# Patient Record
Sex: Male | Born: 1985 | Race: White | Hispanic: No | Marital: Married | Smoking: Never smoker
Health system: Southern US, Community
[De-identification: ages and names within clinical notes are randomized; demographics above are authoritative.]

## PROBLEM LIST (undated history)

## (undated) DIAGNOSIS — N289 Disorder of kidney and ureter, unspecified: Secondary | ICD-10-CM

## (undated) DIAGNOSIS — B2790 Infectious mononucleosis, unspecified without complication: Secondary | ICD-10-CM

## (undated) DIAGNOSIS — G43909 Migraine, unspecified, not intractable, without status migrainosus: Secondary | ICD-10-CM

## (undated) HISTORY — PX: HEMORRHOID SURGERY: SHX153

## (undated) HISTORY — PX: WISDOM TOOTH EXTRACTION: SHX21

---

## 2014-03-25 ENCOUNTER — Emergency Department (HOSPITAL_COMMUNITY): Payer: BLUE CROSS/BLUE SHIELD

## 2014-03-25 ENCOUNTER — Encounter (HOSPITAL_COMMUNITY): Payer: Self-pay | Admitting: *Deleted

## 2014-03-25 ENCOUNTER — Emergency Department (HOSPITAL_COMMUNITY)
Admission: EM | Admit: 2014-03-25 | Discharge: 2014-03-26 | Disposition: A | Discharge status: Home / Self Care | Payer: BLUE CROSS/BLUE SHIELD | Attending: Emergency Medicine | Admitting: Emergency Medicine

## 2014-03-25 DIAGNOSIS — R5383 Other fatigue: Secondary | ICD-10-CM | POA: Diagnosis not present

## 2014-03-25 DIAGNOSIS — G8929 Other chronic pain: Secondary | ICD-10-CM | POA: Diagnosis not present

## 2014-03-25 DIAGNOSIS — R05 Cough: Secondary | ICD-10-CM | POA: Insufficient documentation

## 2014-03-25 DIAGNOSIS — R1110 Vomiting, unspecified: Secondary | ICD-10-CM | POA: Diagnosis not present

## 2014-03-25 DIAGNOSIS — R42 Dizziness and giddiness: Secondary | ICD-10-CM | POA: Diagnosis present

## 2014-03-25 DIAGNOSIS — J029 Acute pharyngitis, unspecified: Secondary | ICD-10-CM | POA: Insufficient documentation

## 2014-03-25 DIAGNOSIS — M545 Low back pain: Secondary | ICD-10-CM | POA: Diagnosis not present

## 2014-03-25 DIAGNOSIS — R6889 Other general symptoms and signs: Secondary | ICD-10-CM

## 2014-03-25 LAB — CBC WITH DIFFERENTIAL/PLATELET
BASOS ABS: 0 10*3/uL (ref 0.0–0.1)
BASOS PCT: 0 % (ref 0–1)
EOS PCT: 5 % (ref 0–5)
Eosinophils Absolute: 0.4 10*3/uL (ref 0.0–0.7)
HCT: 43.7 % (ref 39.0–52.0)
HEMOGLOBIN: 15 g/dL (ref 13.0–17.0)
Lymphocytes Relative: 43 % (ref 12–46)
Lymphs Abs: 4.2 10*3/uL — ABNORMAL HIGH (ref 0.7–4.0)
MCH: 30 pg (ref 26.0–34.0)
MCHC: 34.3 g/dL (ref 30.0–36.0)
MCV: 87.4 fL (ref 78.0–100.0)
Monocytes Absolute: 0.7 10*3/uL (ref 0.1–1.0)
Monocytes Relative: 8 % (ref 3–12)
Neutro Abs: 4.3 10*3/uL (ref 1.7–7.7)
Neutrophils Relative %: 44 % (ref 43–77)
PLATELETS: 238 10*3/uL (ref 150–400)
RBC: 5 MIL/uL (ref 4.22–5.81)
RDW: 13.5 % (ref 11.5–15.5)
WBC: 9.6 10*3/uL (ref 4.0–10.5)

## 2014-03-25 LAB — URINALYSIS, ROUTINE W REFLEX MICROSCOPIC
Bilirubin Urine: NEGATIVE
Glucose, UA: NEGATIVE mg/dL
HGB URINE DIPSTICK: NEGATIVE
KETONES UR: NEGATIVE mg/dL
LEUKOCYTES UA: NEGATIVE
Nitrite: NEGATIVE
PH: 6 (ref 5.0–8.0)
Protein, ur: NEGATIVE mg/dL
Specific Gravity, Urine: 1.025 (ref 1.005–1.030)
UROBILINOGEN UA: 0.2 mg/dL (ref 0.0–1.0)

## 2014-03-25 LAB — BASIC METABOLIC PANEL
Anion gap: 5 (ref 5–15)
BUN: 15 mg/dL (ref 6–23)
CALCIUM: 9.2 mg/dL (ref 8.4–10.5)
CO2: 29 mmol/L (ref 19–32)
Chloride: 104 mmol/L (ref 96–112)
Creatinine, Ser: 0.72 mg/dL (ref 0.50–1.35)
GFR calc non Af Amer: >90 mL/min (ref >90)
Glucose, Bld: 102 mg/dL — ABNORMAL HIGH (ref 70–99)
Potassium: 3.5 mmol/L (ref 3.5–5.1)
Sodium: 138 mmol/L (ref 135–145)

## 2014-03-25 MED ORDER — HYDROCODONE-ACETAMINOPHEN 5-325 MG PO TABS
1.0000 | ORAL_TABLET | Freq: Once | ORAL | Status: AC
  Administered 2014-03-26: 1 via ORAL
  Filled 2014-03-25: qty 1

## 2014-03-25 NOTE — ED Notes (Signed)
Pt c/o feeling dizzy and lightheaded and fatigued; pt states it feels like he has been running a fever

## 2014-03-26 LAB — INFLUENZA PANEL BY PCR (TYPE A & B)
H1N1FLUPCR: NOT DETECTED
INFLBPCR: NEGATIVE
Influenza A By PCR: NEGATIVE

## 2014-03-26 MED ORDER — HYDROCODONE-ACETAMINOPHEN 5-325 MG PO TABS: 1.0000 | ORAL_TABLET | ORAL | Status: DC | PRN

## 2014-03-26 NOTE — Discharge Instructions (Signed)
Your lab tests, chest xray and ekg are all reassuring. I suspect you may have a viral infection that should run its course over the next few days.  Your influenza panel is negative.  Use ibuprofen (advil or motrin) for body aches and sore throat, and vicodin if needed for additional back pain relief.  Do not drive within 4 hours of taking this medicine as it will make you drowsy. Rest and make sure you increase your fluid intake.    Viral Infections A viral infection can be caused by different types of viruses.Most viral infections are not serious and resolve on their own. However, some infections may cause severe symptoms and may lead to further complications. SYMPTOMS Viruses can frequently cause:  Minor sore throat.  Aches and pains.  Headaches.  Runny nose.  Different types of rashes.  Watery eyes.  Tiredness.  Cough.  Loss of appetite.  Gastrointestinal infections, resulting in nausea, vomiting, and diarrhea. These symptoms do not respond to antibiotics because the infection is not caused by bacteria. However, you might catch a bacterial infection following the viral infection. This is sometimes called a "superinfection." Symptoms of such a bacterial infection may include:  Worsening sore throat with pus and difficulty swallowing.  Swollen neck glands.  Chills and a high or persistent fever.  Severe headache.  Tenderness over the sinuses.  Persistent overall ill feeling (malaise), muscle aches, and tiredness (fatigue).  Persistent cough.  Yellow, green, or brown mucus production with coughing. HOME CARE INSTRUCTIONS   Only take over-the-counter or prescription medicines for pain, discomfort, diarrhea, or fever as directed by your caregiver.  Drink enough water and fluids to keep your urine clear or pale yellow. Sports drinks can provide valuable electrolytes, sugars, and hydration.  Get plenty of rest and maintain proper nutrition. Soups and broths with  crackers or rice are fine. SEEK IMMEDIATE MEDICAL CARE IF:   You have severe headaches, shortness of breath, chest pain, neck pain, or an unusual rash.  You have uncontrolled vomiting, diarrhea, or you are unable to keep down fluids.  You or your child has an oral temperature above 102 F (38.9 C), not controlled by medicine.  Your baby is older than 3 months with a rectal temperature of 102 F (38.9 C) or higher.  Your baby is 43 months old or younger with a rectal temperature of 100.4 F (38 C) or higher. MAKE SURE YOU:   Understand these instructions.  Will watch your condition.  Will get help right away if you are not doing well or get worse. Document Released: 10/22/2004 Document Revised: 04/06/2011 Document Reviewed: 05/19/2010 [facility] Patient Information 2015 [place] [place]. This information is not intended to replace advice given to you by your health care provider. Make sure you discuss any questions you have with your health care provider.

## 2014-03-26 NOTE — ED Notes (Signed)
Pt given water to drink. 

## 2014-03-26 NOTE — ED Provider Notes (Signed)
CSN: [id number]     Arrival date & time 03/25/14  1941 History   First MD Initiated Contact with Patient 03/25/14 2210     Chief Complaint  Patient presents with  . Dizziness     (Consider location/radiation/quality/duration/timing/severity/associated sxs/prior Treatment) The history is provided by the patient and the spouse.   Dean Wagner is a 29 y.o. male visiting the area this week on vacation from [place] presenting with a 1 day history of a nonproductive cough, mild sore throat, subjective fever, nausea without emesis and generalized fatigue along with feeling lightheaded and palpitations which is worsened with ambulating.  He states he feels like he did when he has mono years ago without the severe sore throat.   He has tolerated PO fluids but has had a reduced appetite.  He denies chest pain, sob or wheezing, abdominal pain, vomiting, diarrhea, hematuria or dysuria.  He denies nasal or sinus congestion, ear pain or headache.  Additionally describes acute on chronic low back pain since having to lift a dog crate out of their vehicle yesterday.  He has no radiation into his legs.  There has been no weakness or numbness in the lower extremities and no urinary or bowel retention or incontinence.  Patient does not have a history of cancer or IVDU.  The patient has taken tylenol without relief.     History reviewed. No pertinent past medical history. Past Surgical History  Procedure Laterality Date  . Wisdom tooth extraction    . Hemorrhoid surgery     History reviewed. No pertinent family history. History  Substance Use Topics  . Smoking status: Never Smoker   . Smokeless tobacco: Not on file  . Alcohol Use: No    Review of Systems  Constitutional: Positive for fever, chills and fatigue.  HENT: Positive for sore throat. Negative for congestion and sinus pressure.   Eyes: Negative.   Respiratory: Positive for cough. Negative for chest tightness, shortness of breath and  wheezing.   Cardiovascular: Negative for chest pain.  Gastrointestinal: Positive for nausea. Negative for vomiting and abdominal pain.  Genitourinary: Negative.  Negative for dysuria, hematuria and flank pain.  Musculoskeletal: Positive for back pain. Negative for joint swelling and neck pain.  Skin: Negative.  Negative for rash and wound.  Neurological: Positive for light-headedness. Negative for dizziness, weakness, numbness and headaches.  Psychiatric/Behavioral: Negative.       Allergies  Other; Biaxin; and Percocet  Home Medications   Prior to Admission medications   Medication Sig Start Date End Date Taking? Authorizing Provider  HYDROcodone-acetaminophen (NORCO/VICODIN) 5-325 MG per tablet Take 1 tablet by mouth every 4 (four) hours as needed. 03/26/14   Burgess Amor, PA-C   BP 119/65 mmHg  Pulse 87  Temp(Src) 98.5 F (36.9 C) (Oral)  Resp 0  Ht  (1.702 m)  Wt 215 lb (97.523 kg)  BMI 33.67 kg/m2  SpO2 98% Physical Exam  Constitutional: He appears well-developed and well-nourished.  HENT:  Head: Normocephalic and atraumatic.  Eyes: Conjunctivae are normal.  Neck: Normal range of motion. Neck supple.  Cardiovascular: Normal rate, regular rhythm, normal heart sounds and intact distal pulses.   Pedal pulses normal.  Pulmonary/Chest: Effort normal and breath sounds normal. No respiratory distress. He has no decreased breath sounds. He has no wheezes. He has no rhonchi. He exhibits no tenderness.  Abdominal: Soft. Bowel sounds are normal. He exhibits no distension and no mass. There is no tenderness.  No flank pain  Musculoskeletal: Normal  range of motion. He exhibits no edema.       Lumbar back: He exhibits tenderness. He exhibits no swelling, no edema and no spasm.  ttp lumbar midline and bilateral paralumbar soft tissue.  Lymphadenopathy:       Head (right side): No occipital adenopathy present.       Head (left side): No occipital adenopathy present.    He has no  cervical adenopathy.  Neurological: He is alert. He has normal strength. He displays no atrophy and no tremor. No sensory deficit. Gait normal.  Reflex Scores:      Patellar reflexes are 2+ on the right side and 2+ on the left side.      Achilles reflexes are 2+ on the right side and 2+ on the left side. No strength deficit noted in hip and knee flexor and extensor muscle groups.  Ankle flexion and extension intact.  Skin: Skin is warm and dry.  Psychiatric: He has a normal mood and affect.  Nursing note and vitals reviewed.   ED Course  Procedures (including critical care time) Labs Review Labs Reviewed  CBC WITH DIFFERENTIAL/PLATELET - Abnormal; Notable for the following:    Lymphs Abs 4.2 (*)    All other components within normal limits  BASIC METABOLIC PANEL - Abnormal; Notable for the following:    Glucose, Bld 102 (*)    All other components within normal limits  URINALYSIS, ROUTINE W REFLEX MICROSCOPIC  INFLUENZA PANEL BY PCR (TYPE A & B, H1N1)    Imaging Review Dg Chest 2 View  03/25/2014   CLINICAL DATA:  Acute onset of dizziness, lightheadedness and fatigue. Fever. Initial encounter.  EXAM: CHEST  2 VIEW  COMPARISON:  None.  FINDINGS: The lungs are well-aerated and clear. There is no evidence of focal opacification, pleural effusion or pneumothorax.  The heart is normal in size; the mediastinal contour is within normal limits. No acute osseous abnormalities are seen.  IMPRESSION: No acute cardiopulmonary process seen.   Electronically Signed   By: Roanna RaiderJeffery  Chang M.D.   On: 03/25/2014 20:32     EKG Interpretation   Date/Time:  Sunday March 25 2014 23:24:40 EST Ventricular Rate:  85 PR Interval:  182 QRS Duration: 100 QT Interval:  384 QTC Calculation: 456 R Axis:   5 Text Interpretation:  * Poor data quality, interpretation may be  adversely affected Normal sinus rhythm Inferior infarct , age undetermined  Cannot rule out Anterior infarct , age undetermined  Abnormal ECG No  previous ECGs available Confirmed by ZACKOWSKI  MD, SCOTT [id number]) on  03/26/2014 12:50:46 AM      MDM   Final diagnoses:  Chronic low back pain  Flu-like symptoms    Flu like/viral syndrome suspected.  Labs, xrays, ekg reviewed. Pt was not orthostatic during obtaining orthostatic vitals.  He tolerated PO intake while here.   Pt encouraged rest, increased fluid intake.  Recheck here for any worsened sx.  Pt states plans are to stay for a week before returning home.  The patient appears reasonably screened and/or stabilized for discharge and I doubt any other medical condition or other [facility] requiring further screening, evaluation, or treatment in the ED at this time prior to discharge.   No neuro deficit on exam or by history to suggest emergent or surgical presentation.  Also discussed worsened sx that should prompt immediate re-evaluation including distal weakness, bowel/bladder retention/incontinence.          Burgess AmorJulie Odies Desa, PA-C 03/26/14 1314  Scott  Deretha Emory, MD 03/27/14 [id number]

## 2014-03-26 NOTE — ED Notes (Signed)
Patient states that he is in a lot of pain. States that nurse was suppose to give him pain medication.

## 2015-04-08 ENCOUNTER — Emergency Department (HOSPITAL_COMMUNITY)
Admission: EM | Admit: 2015-04-08 | Discharge: 2015-04-08 | Disposition: A | Discharge status: Home / Self Care | Payer: 59 | Attending: Emergency Medicine | Admitting: Emergency Medicine

## 2015-04-08 ENCOUNTER — Emergency Department (HOSPITAL_COMMUNITY): Payer: 59

## 2015-04-08 ENCOUNTER — Encounter (HOSPITAL_COMMUNITY): Payer: Self-pay | Admitting: Emergency Medicine

## 2015-04-08 DIAGNOSIS — Z79899 Other long term (current) drug therapy: Secondary | ICD-10-CM | POA: Diagnosis not present

## 2015-04-08 DIAGNOSIS — G43909 Migraine, unspecified, not intractable, without status migrainosus: Secondary | ICD-10-CM | POA: Insufficient documentation

## 2015-04-08 DIAGNOSIS — R509 Fever, unspecified: Secondary | ICD-10-CM | POA: Diagnosis present

## 2015-04-08 DIAGNOSIS — R109 Unspecified abdominal pain: Secondary | ICD-10-CM | POA: Diagnosis not present

## 2015-04-08 DIAGNOSIS — M545 Low back pain: Secondary | ICD-10-CM | POA: Diagnosis not present

## 2015-04-08 DIAGNOSIS — B349 Viral infection, unspecified: Secondary | ICD-10-CM

## 2015-04-08 HISTORY — DX: Disorder of kidney and ureter, unspecified: N28.9

## 2015-04-08 HISTORY — DX: Migraine, unspecified, not intractable, without status migrainosus: G43.909

## 2015-04-08 HISTORY — DX: Infectious mononucleosis, unspecified without complication: B27.90

## 2015-04-08 LAB — URINALYSIS, ROUTINE W REFLEX MICROSCOPIC
BILIRUBIN URINE: NEGATIVE
GLUCOSE, UA: NEGATIVE mg/dL
HGB URINE DIPSTICK: NEGATIVE
KETONES UR: NEGATIVE mg/dL
Leukocytes, UA: NEGATIVE
Nitrite: NEGATIVE
PH: 7 (ref 5.0–8.0)
Protein, ur: NEGATIVE mg/dL
Specific Gravity, Urine: 1.015 (ref 1.005–1.030)

## 2015-04-08 LAB — RAPID STREP SCREEN (MED CTR MEBANE ONLY): Streptococcus, Group A Screen (Direct): NEGATIVE

## 2015-04-08 MED ORDER — KETOROLAC TROMETHAMINE 60 MG/2ML IM SOLN
60.0000 mg | Freq: Once | INTRAMUSCULAR | Status: AC
Start: 2015-04-08 — End: 2015-04-08
  Administered 2015-04-08: 60 mg via INTRAMUSCULAR
  Filled 2015-04-08: qty 2

## 2015-04-08 MED ORDER — BENZONATATE 100 MG PO CAPS: 100.0000 mg | ORAL_CAPSULE | Freq: Three times a day (TID) | ORAL | Status: DC | PRN

## 2015-04-08 MED ORDER — ACETAMINOPHEN 500 MG PO TABS
1000.0000 mg | ORAL_TABLET | Freq: Once | ORAL | Status: DC
  Filled 2015-04-08: qty 2

## 2015-04-08 MED ORDER — PROMETHAZINE HCL 25 MG/ML IJ SOLN
25.0000 mg | Freq: Once | INTRAMUSCULAR | Status: AC
  Administered 2015-04-08: 25 mg via INTRAMUSCULAR
  Filled 2015-04-08: qty 1

## 2015-04-08 MED ORDER — IBUPROFEN 400 MG PO TABS
400.0000 mg | ORAL_TABLET | Freq: Once | ORAL | Status: DC
Start: 2015-04-08 — End: 2015-04-08
  Filled 2015-04-08: qty 1

## 2015-04-08 MED ORDER — PROMETHAZINE HCL 25 MG PO TABS: 25.0000 mg | ORAL_TABLET | Freq: Four times a day (QID) | ORAL | Status: DC | PRN

## 2015-04-08 NOTE — ED Notes (Signed)
Pt c/o fever/body aches/congestion today. Pt also c/o bilateral flank pain with n/v and migraine today.

## 2015-04-08 NOTE — Discharge Instructions (Signed)
Take over the counter tylenol and ibuprofen, as directed on packaging, as needed for discomfort. Take the prescriptions as directed. Gargle with warm water several times per day to help with discomfort.  May also use over the counter sore throat pain medicines such as chloraseptic or sucrets, as directed on packaging, as needed for discomfort. Your CT scan showed an incidental finding: "There is a 2.3 x 1.2 cm soft tissue area to the left of the left crus of the diaphragm and anterior to the left adrenal gland. This may represent an abnormally enlarged lymph node. There is otherwise no evidence of retroperitoneal adenopathy.At the minimum, three-month follow-up study is recommended to ensure resolution of this finding." Call your regular medical doctor tomorrow to schedule a follow up appointment in the next 2 to 3 days and to follow this CT finding.  Return to the Emergency Department immediately if worsening.

## 2015-04-08 NOTE — ED Provider Notes (Signed)
CSN: [id number]     Arrival date & time 04/08/15  1537 History   First MD Initiated Contact with Patient 04/08/15 1723     Chief Complaint  Patient presents with  . Fever      HPI Pt was seen at 1730.  Per pt, c/o gradual onset and persistence of constant sore throat, runny/stuffy nose, sinus congestion, and cough for the past day. Has been associated with bilat lower back "pain," as well as generalized body aches/fatigue, several intermittent episodes of N/V and "migraine headache."  Denies fevers, no rash, no CP/SOB, no diarrhea, no abd pain. Pt has not taken any meds for his symptoms. Describes the headache as per her usual chronic migraine headache pain pattern x8 years.  Denies headache was sudden or maximal in onset or at any time.  Denies visual changes, no focal motor weakness, no tingling/numbness in extremities, no objective fevers, no neck pain, no rash.     Past Medical History  Diagnosis Date  . Renal disorder     kidney stones  . Mononucleosis   . Migraine    Past Surgical History  Procedure Laterality Date  . Wisdom tooth extraction    . Hemorrhoid surgery      Social History  Substance Use Topics  . Smoking status: Never Smoker   . Smokeless tobacco: None  . Alcohol Use: No    Review of Systems ROS: Statement: All systems negative except as marked or noted in the HPI; Constitutional: Negative for fever and chills. +generalized body aches/fatigue. ; ; Eyes: Negative for eye pain, redness and discharge. ; ; ENMT: Negative for ear pain, hoarseness, +nasal congestion, sinus pressure and sore throat. ; ; Cardiovascular: Negative for chest pain, palpitations, diaphoresis, dyspnea and peripheral edema. ; ; Respiratory: +cough. Negative for wheezing and stridor. ; ; Gastrointestinal: Negative for nausea, vomiting, diarrhea, abdominal pain, blood in stool, hematemesis, jaundice and rectal bleeding. . ; ; Genitourinary: Negative for dysuria and hematuria. ; ; Musculoskeletal:  +LBP. Negative for neck pain. Negative for swelling and trauma.; ; Skin: Negative for pruritus, rash, abrasions, blisters, bruising and skin lesion.; ; Neuro: Negative for lightheadedness and neck stiffness. Negative for weakness, altered level of consciousness , altered mental status, extremity weakness, paresthesias, involuntary movement, seizure and syncope.      Allergies  Other; Biaxin; and Percocet  Home Medications   Prior to Admission medications   Medication Sig Start Date End Date Taking? Authorizing Provider  HYDROcodone-acetaminophen (NORCO/VICODIN) 5-325 MG per tablet Take 1 tablet by mouth every 4 (four) hours as needed. 03/26/14  Yes Raynelle Fanning Idol, PA-C  ondansetron (ZOFRAN) 8 MG tablet Take 8 mg by mouth every 8 (eight) hours as needed for nausea or vomiting.   Yes Historical Provider, MD  ondansetron (ZOFRAN-ODT) 8 MG disintegrating tablet Take 8 mg by mouth every 8 (eight) hours as needed for nausea or vomiting.   Yes Historical Provider, MD  tamsulosin (FLOMAX) 0.4 MG CAPS capsule Take 0.4 mg by mouth daily.   Yes Historical Provider, MD   BP 90/54 mmHg  Pulse 101  Temp(Src) 99.1 F (37.3 C) (Oral)  Resp 18  Ht  (1.676 m)  Wt 218 lb (98.884 kg)  BMI 35.20 kg/m2  SpO2 97% Physical Exam 1735: Physical examination:  Nursing notes reviewed; Vital signs and O2 SAT reviewed;  Constitutional: Well developed, Well nourished, Well hydrated, In no acute distress; Head:  Normocephalic, atraumatic; Eyes: EOMI, PERRL, No scleral icterus; ENMT: TM's clear bilat. +edemetous nasal  turbinates bilat with clear rhinorrhea. Mouth and pharynx without lesions. No tonsillar exudates. No intra-oral edema. No submandibular or sublingual edema. No hoarse voice, no drooling, no stridor. No pain with manipulation of larynx. No trismus. Mouth and pharynx normal, Mucous membranes moist; Neck: Supple, Full range of motion, No lymphadenopathy; Cardiovascular: Regular rate and rhythm, No murmur, rub,  or gallop; Respiratory: Breath sounds clear & equal bilaterally, No rales, rhonchi, wheezes.  Speaking full sentences with ease, Normal respiratory effort/excursion; Chest: Nontender, Movement normal; Abdomen: Soft, Nontender, Nondistended, Normal bowel sounds; Genitourinary: No CVA tenderness; Extremities: Pulses normal, No tenderness, No edema, No calf edema or asymmetry.; Neuro: AA&Ox3, Major CN grossly intact.  Speech clear. No gross focal motor or sensory deficits in extremities.; Skin: Color normal, Warm, Dry.   ED Course  Procedures (including critical care time) Labs Review  Imaging Review  I have personally reviewed and evaluated these images and lab results as part of my medical decision-making.   EKG Interpretation None      MDM  MDM Reviewed: previous chart, nursing note and vitals Reviewed previous: labs Interpretation: labs, x-ray and CT scan      Results for orders placed or performed during the hospital encounter of 04/08/15  Rapid strep screen  Result Value Ref Range   Streptococcus, Group A Screen (Direct) NEGATIVE NEGATIVE  Urinalysis, Routine w reflex microscopic (not at [facility])  Result Value Ref Range   Color, Urine YELLOW YELLOW   APPearance CLEAR CLEAR   Specific Gravity, Urine 1.015 1.005 - 1.030   pH 7.0 5.0 - 8.0   Glucose, UA NEGATIVE NEGATIVE mg/dL   Hgb urine dipstick NEGATIVE NEGATIVE   Bilirubin Urine NEGATIVE NEGATIVE   Ketones, ur NEGATIVE NEGATIVE mg/dL   Protein, ur NEGATIVE NEGATIVE mg/dL   Nitrite NEGATIVE NEGATIVE   Leukocytes, UA NEGATIVE NEGATIVE   Dg Chest 2 View 04/08/2015  CLINICAL DATA:  Fever, cough, congestion and body aches. Bilateral flank pain with nausea and vomiting as well as migraines. EXAM: CHEST  2 VIEW COMPARISON:  03/25/2014 FINDINGS: Lungs are clear. Cardiomediastinal silhouette and remainder of the exam is unchanged. IMPRESSION: No active cardiopulmonary disease. Electronically Signed   By: Elberta Fortisaniel  Boyle M.D.   On:  04/08/2015 16:12   Ct Renal Stone Study 04/08/2015  CLINICAL DATA:  Fever and body aches. Bilateral flank pain. History of kidney stones. EXAM: CT ABDOMEN AND PELVIS WITHOUT CONTRAST TECHNIQUE: Multidetector CT imaging of the abdomen and pelvis was performed following the standard protocol without IV contrast. COMPARISON:  None. FINDINGS: No hydronephrosis. 4 mm calculus in the interpolar region of the left kidney. Tiny calculus in the lower pole of the right kidney. No ureteral calculus. Simple cyst in the upper pole of the left kidney. Several gallstones are in place. Diffuse hepatic steatosis. Spleen, pancreas, adrenal glands are within normal limits. Normal appendix. No evidence of bowel obstruction. No free-fluid. There is a 2.3 x 1.2 cm soft tissue area to the left of the left crus of the diaphragm and anterior to the left adrenal gland. This may represent an abnormally enlarged lymph node. There is otherwise no evidence of retroperitoneal adenopathy. Shallow central protrusion at L4-5. IMPRESSION: Bilateral nephrolithiasis.  No evidence of urinary obstruction Cholelithiasis. There is a soft tissue abnormality in the upper abdomen retroperitoneum which may represent a single abnormally enlarged lymph node. At the minimum, three-month follow-up study is recommended to ensure resolution of this finding. Differential diagnosis includes inflammatory and neoplastic etiology. Electronically Signed   By:  Jolaine Click M.D.   On: 04/08/2015 18:56    1915:  Workup reassuring. Has tol PO well while in the ED without N/V, abd remains benign. Tx symptomatically at this time. Dx and testing d/w pt and family.  Questions answered.  Verb understanding, agreeable to d/c home with outpt f/u.   Samuel Jester, DO 04/12/15 1727

## 2015-04-11 LAB — CULTURE, GROUP A STREP (THRC)

## 2015-11-19 ENCOUNTER — Other Ambulatory Visit (HOSPITAL_COMMUNITY): Payer: Self-pay | Admitting: Internal Medicine

## 2015-11-19 DIAGNOSIS — R945 Abnormal results of liver function studies: Secondary | ICD-10-CM

## 2015-11-25 ENCOUNTER — Ambulatory Visit (HOSPITAL_COMMUNITY): Admission: RE | Admit: 2015-11-25 | Payer: 59 | Source: Ambulatory Visit

## 2015-12-04 ENCOUNTER — Ambulatory Visit (HOSPITAL_COMMUNITY)
Admission: RE | Admit: 2015-12-04 | Discharge: 2015-12-04 | Disposition: A | Discharge status: Home / Self Care | Payer: 59 | Source: Ambulatory Visit | Attending: Internal Medicine | Admitting: Internal Medicine

## 2015-12-04 DIAGNOSIS — K760 Fatty (change of) liver, not elsewhere classified: Secondary | ICD-10-CM | POA: Insufficient documentation

## 2015-12-04 DIAGNOSIS — R945 Abnormal results of liver function studies: Secondary | ICD-10-CM

## 2015-12-04 DIAGNOSIS — K8020 Calculus of gallbladder without cholecystitis without obstruction: Secondary | ICD-10-CM | POA: Diagnosis not present

## 2015-12-26 ENCOUNTER — Encounter: Payer: Self-pay | Admitting: Internal Medicine

## 2016-01-10 ENCOUNTER — Ambulatory Visit: Payer: 59 | Admitting: Gastroenterology

## 2016-02-19 ENCOUNTER — Ambulatory Visit (INDEPENDENT_AMBULATORY_CARE_PROVIDER_SITE_OTHER): Payer: 59 | Admitting: Gastroenterology

## 2016-02-19 ENCOUNTER — Encounter: Payer: Self-pay | Admitting: Gastroenterology

## 2016-02-19 DIAGNOSIS — R740 Nonspecific elevation of levels of transaminase and lactic acid dehydrogenase [LDH]: Secondary | ICD-10-CM

## 2016-02-19 DIAGNOSIS — R7401 Elevation of levels of liver transaminase levels: Secondary | ICD-10-CM | POA: Insufficient documentation

## 2016-02-19 NOTE — Progress Notes (Signed)
ON RECALL  °

## 2016-02-19 NOTE — Progress Notes (Signed)
Subjective:    Patient ID: Dean Wagner, male    DOB: 07-30-1985, 31 y.o.   MRN: [id number]  Dwana MelenaZack Hall, MD  HPI DOING WELL. ROUTINE LABS FOUND HIM WITH ELEVATED LIVER ENZYMES. SAW PCP AND US SHOWS "ADVANCED HEPATIC STEATOSIS".  BMs GOOD WITH OCCASIONAL DIARRHEA. IBUPROFEN PRN HAs. NO TOBACCO PRODUCTS. QUIT DRINKING > 10 YEARS AGO. NO WEIGHT LOSS OR CHANGE IN APPETITE. PCP HAVE INFO ON DIET AND ALSO SAW A NUTRITIONIST DUE TO HIS OB SENDING HIM.  PT DENIES FEVER, CHILLS, HEMATOCHEZIA, HEMATEMESIS, nausea, vomiting, melena, CHEST PAIN, SHORTNESS OF BREATH,  CHANGE IN BOWEL IN HABITS, constipation, abdominal pain, problems swallowing, OR heartburn or indigestion.  Past Medical History:  Diagnosis Date  . Migraine   . Mononucleosis   . Renal disorder    kidney stones   Past Surgical History:  Procedure Laterality Date  . HEMORRHOID SURGERY    . WISDOM TOOTH EXTRACTION     Allergies  Allergen Reactions  . Other Anaphylaxis    All mint family. Camphor and Menthol   . Biaxin [Clarithromycin] Other (See Comments)    comatose  . Percocet [Oxycodone-Acetaminophen] Nausea And Vomiting   Current Outpatient Prescriptions  Medication Sig Dispense Refill  . tamsulosin (FLOMAX) 0.4 MG CAPS capsule Take 0.4 mg by mouth daily.    . benzonatate (TESSALON) 100 MG capsule Take 1 capsule (100 mg total) by mouth 3 (three) times daily as needed for cough. (Patient not taking: Reported on 02/19/2016)    .      . ondansetron (ZOFRAN) 8 MG tablet Take 8 mg by mouth every 8 (eight) hours as needed for nausea or vomiting.    . ondansetron (ZOFRAN-ODT) 8 MG disintegrating tablet Take 8 mg by mouth every 8 (eight) hours as needed for nausea or vomiting.    .        Family History  Problem Relation Age of Onset  . Cirrhosis Other   . Cirrhosis Other   . Colon cancer Neg Hx   . Colonic polyp Neg Hx   AUNT HAS COPD.  Social History   Social History  . Marital status: Married    Spouse name: N/A   . Number of children: 0  . Years of education: N/A   Occupational History  . 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   Social History Main Topics  . Smoking status: Never Smoker  . Smokeless tobacco: Never Used  . Alcohol use No  . Drug use: No  . Sexual activity: Not Asked   Other Topics Concern  . None   Social History Narrative   BEEN WITH WIFE FOR 10 YEARS. FROM [place]. MOVED TO [place] 3 YEARS AGO. 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    Review of Systems PER HPI OTHERWISE ALL SYSTEMS ARE NEGATIVE.    Objective:   Physical Exam  Constitutional: He is oriented to person, place, and time. He appears well-developed and well-nourished. No distress.  HENT:  Head: Normocephalic and atraumatic.  Mouth/Throat: Oropharynx is clear and moist. No oropharyngeal exudate.  Eyes: Pupils are equal, round, and reactive to light. No scleral icterus.  Neck: Normal range of motion. Neck supple.  Cardiovascular: Normal rate, regular rhythm and normal heart sounds.   Pulmonary/Chest: Effort normal and breath sounds normal. No respiratory distress.  Abdominal: Soft. Bowel sounds are normal. He exhibits no distension. There is no tenderness.  Musculoskeletal: He exhibits no edema.  Lymphadenopathy:  He has no cervical adenopathy.  Neurological: He is alert and oriented to person, place, and time.  NO  NEW FOCAL DEFICITS  Psychiatric:  FLAT AFFECT, NL MOOD  Vitals reviewed.     Assessment & Plan:

## 2016-02-19 NOTE — Patient Instructions (Signed)
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   COMPLETE LABS. YOU NEED TO GET LABS DRAWN WHEN YOU HAVE BEEN FASTING FOR 8 HOURS.  CONTINUE YOUR WEIGHT LOSS EFFORTS.  FOLLOW A LOW FAT DIET.  FOLLOW UP IN 6 MOS.

## 2016-02-19 NOTE — Assessment & Plan Note (Addendum)
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 YOU NEED TO GET LABS DRAWN WHEN YOU HAVE BEEN FASTING FOR 8 HOURS. CONTINUE YOUR WEIGHT LOSS EFFORTS. STRESSED THE IMPORTANCE OF WEIGHT LOSS. FOLLOW A LOW FAT DIET. REVIEWED LABS/IMAGING FROM 2016 TO PRESENT. FOLLOW UP IN 6 MOS.   GREATER THAN 50% WAS SPENT IN COUNSELING & COORDINATION OF CARE WITH THE PATIENT: DISCUSSED DIFFERENTIAL DIAGNOSIS, PROCEDURE, BENEFITS, RISKS, AND MANAGEMENT OF NASH AND PREVENTION OF CIRRHOSIS. TOTAL ENCOUNTER TIME: 45 MINS.

## 2016-02-20 NOTE — Progress Notes (Signed)
CC'D TO PCP °

## 2016-04-10 ENCOUNTER — Encounter (HOSPITAL_COMMUNITY): Payer: Self-pay | Admitting: *Deleted

## 2016-04-10 ENCOUNTER — Emergency Department (HOSPITAL_COMMUNITY): Payer: 59

## 2016-04-10 ENCOUNTER — Emergency Department (HOSPITAL_COMMUNITY)
Admission: EM | Admit: 2016-04-10 | Discharge: 2016-04-10 | Disposition: A | Discharge status: Home / Self Care | Payer: 59 | Attending: Emergency Medicine | Admitting: Emergency Medicine

## 2016-04-10 DIAGNOSIS — N200 Calculus of kidney: Secondary | ICD-10-CM | POA: Diagnosis not present

## 2016-04-10 DIAGNOSIS — Z79899 Other long term (current) drug therapy: Secondary | ICD-10-CM | POA: Diagnosis not present

## 2016-04-10 DIAGNOSIS — R109 Unspecified abdominal pain: Secondary | ICD-10-CM | POA: Diagnosis present

## 2016-04-10 LAB — URINALYSIS, MICROSCOPIC (REFLEX)

## 2016-04-10 LAB — URINALYSIS, ROUTINE W REFLEX MICROSCOPIC
Bilirubin Urine: NEGATIVE
Glucose, UA: NEGATIVE mg/dL
Ketones, ur: NEGATIVE mg/dL
LEUKOCYTES UA: NEGATIVE
NITRITE: NEGATIVE
PROTEIN: NEGATIVE mg/dL
Specific Gravity, Urine: 1.01 (ref 1.005–1.030)
pH: 6 (ref 5.0–8.0)

## 2016-04-10 MED ORDER — OXYCODONE-ACETAMINOPHEN 5-325 MG PO TABS: 1.0000 | ORAL_TABLET | Freq: Three times a day (TID) | ORAL | 0 refills | Status: AC | PRN

## 2016-04-10 MED ORDER — ONDANSETRON 4 MG PO TBDP: 4.0000 mg | ORAL_TABLET | Freq: Three times a day (TID) | ORAL | 0 refills | Status: AC | PRN

## 2016-04-10 MED ORDER — SODIUM CHLORIDE 0.9 % IV BOLUS (SEPSIS)
1000.0000 mL | Freq: Once | INTRAVENOUS | Status: AC
  Administered 2016-04-10: 1000 mL via INTRAVENOUS

## 2016-04-10 MED ORDER — ONDANSETRON HCL 4 MG/2ML IJ SOLN
4.0000 mg | Freq: Once | INTRAMUSCULAR | Status: AC
  Administered 2016-04-10: 4 mg via INTRAVENOUS
  Filled 2016-04-10: qty 2

## 2016-04-10 MED ORDER — KETOROLAC TROMETHAMINE 30 MG/ML IJ SOLN
15.0000 mg | Freq: Once | INTRAMUSCULAR | Status: AC
  Administered 2016-04-10: 15 mg via INTRAVENOUS
  Filled 2016-04-10: qty 1

## 2016-04-10 MED ORDER — FENTANYL CITRATE (PF) 100 MCG/2ML IJ SOLN
25.0000 ug | Freq: Once | INTRAMUSCULAR | Status: AC
  Administered 2016-04-10: 25 ug via INTRAVENOUS
  Filled 2016-04-10: qty 2

## 2016-04-10 MED ORDER — HYDROCODONE-ACETAMINOPHEN 5-325 MG PO TABS: 1.0000 | ORAL_TABLET | Freq: Four times a day (QID) | ORAL | 0 refills | Status: AC | PRN

## 2016-04-10 MED ORDER — IBUPROFEN 400 MG PO TABS: 400.0000 mg | ORAL_TABLET | Freq: Three times a day (TID) | ORAL | 0 refills | Status: AC

## 2016-04-10 NOTE — ED Provider Notes (Signed)
[facility] DEPT Provider Note   CSN: [id number] Arrival date & time: 04/10/16  1600     History   Chief Complaint Chief Complaint  Patient presents with  . Flank Pain    HPI Quincey Quesinberry is a 31 y.o. male.  HPI  She presents with concern for flank pain, nausea, diarrhea, retching. Onset was 4 hours ago, sudden. Since onset patient has had severe sharp pain, crampy. No relief with anything. No dysuria, no hematuria. Patient states that the pain is similar to that he has expressed with prior kidney stones. Patient was well prior to the onset of symptoms, denies other substantial medical problems.   Past Medical History:  Diagnosis Date  . Migraine   . Mononucleosis   . Renal disorder    kidney stones    Patient Active Problem List   Diagnosis Date Noted  . Transaminitis 02/19/2016    Past Surgical History:  Procedure Laterality Date  . HEMORRHOID SURGERY    . WISDOM TOOTH EXTRACTION         Home Medications    Prior to Admission medications   Medication Sig Start Date End Date Taking? Authorizing Provider  cyclobenzaprine (FLEXERIL) 10 MG tablet Take 1 tablet by mouth every 8 (eight) hours as needed. 03/23/16  Yes Historical Provider, MD  fexofenadine (ALLEGRA) 60 MG tablet Take 120 mg by mouth at bedtime.   Yes Historical Provider, MD  KRILL OIL PO Take 2 capsules by mouth daily.   Yes Historical Provider, MD  Melatonin 10 MG CAPS Take 1 capsule by mouth at bedtime.   Yes Historical Provider, MD  Multiple Vitamin (MULTIVITAMIN) tablet Take 1 tablet by mouth daily.   Yes Historical Provider, MD  benzonatate (TESSALON) 100 MG capsule Take 1 capsule (100 mg total) by mouth 3 (three) times daily as needed for cough. Patient not taking: Reported on 02/19/2016 04/08/15   Samuel Jester, DO  promethazine (PHENERGAN) 25 MG tablet Take 1 tablet (25 mg total) by mouth every 6 (six) hours as needed for nausea or vomiting. Patient not taking: Reported on  02/19/2016 04/08/15   Samuel Jester, DO    Family History Family History  Problem Relation Age of Onset  . Cirrhosis Other   . Cirrhosis Other   . Colon cancer Neg Hx   . Colonic polyp Neg Hx     Social History Social History  Substance Use Topics  . Smoking status: Never Smoker  . Smokeless tobacco: Never Used  . Alcohol use No     Allergies   Other; Biaxin [clarithromycin]; and Percocet [oxycodone-acetaminophen]   Review of Systems Review of Systems  Constitutional:       Per HPI, otherwise negative  HENT:       Per HPI, otherwise negative  Respiratory:       Per HPI, otherwise negative  Cardiovascular:       Per HPI, otherwise negative  Gastrointestinal: Positive for nausea and vomiting.  Endocrine:       Negative aside from HPI  Genitourinary:       Neg aside from HPI   Musculoskeletal:       Per HPI, otherwise negative  Skin: Negative.   Neurological: Negative for syncope.     Physical Exam Updated Vital Signs BP (!) 153/94   Pulse 68   Temp 97.7 F (36.5 C) (Oral)   Resp 17   SpO2 100%   Physical Exam  Constitutional: He is oriented to person, place, and time. He appears  well-developed.  Uncomfortable appearing young male awake and alert  HENT:  Head: Normocephalic and atraumatic.  Eyes: Conjunctivae and EOM are normal.  Cardiovascular: Normal rate and regular rhythm.   Pulmonary/Chest: Effort normal. No stridor. No respiratory distress.  Abdominal: He exhibits no distension.  Mild tenderness to palpation, left lateral abdomen  Musculoskeletal: He exhibits no edema.  Neurological: He is alert and oriented to person, place, and time.  Skin: Skin is warm and dry.  Psychiatric: He has a normal mood and affect.  Nursing note and vitals reviewed.    ED Treatments / Results  Labs (all labs ordered are listed, but only abnormal results are displayed) Labs Reviewed  URINALYSIS, ROUTINE W REFLEX MICROSCOPIC - Abnormal; Notable for the  following:       Result Value   Hgb urine dipstick LARGE (*)    All other components within normal limits  URINALYSIS, MICROSCOPIC (REFLEX) - Abnormal; Notable for the following:    Bacteria, UA FEW (*)    Squamous Epithelial / LPF 0-5 (*)    All other components within normal limits   Radiology Ct Renal Stone Study  Result Date: 04/10/2016 CLINICAL DATA:  Initial evaluation for acute left flank pain. EXAM: CT ABDOMEN AND PELVIS WITHOUT CONTRAST TECHNIQUE: Multidetector CT imaging of the abdomen and pelvis was performed following the standard protocol without IV contrast. COMPARISON:  Prior CT from 04/08/2015. FINDINGS: Lower chest: Mild atelectatic changes present within the lung bases, left greater than right. Visualized lungs otherwise clear. Hepatobiliary: Hepatic steatosis. Liver otherwise unremarkable. Cholelithiasis. No biliary dilatation. Pancreas: Mild diffuse fatty infiltration of the pancreas noted. Pancreas otherwise unremarkable. Spleen: Spleen within normal limits. Adrenals/Urinary Tract: The adrenal glands are normal. A 5 mm obstructive stone at the left UPJ with secondary mild to moderate left hydroureteronephrosis. No other stones present distally within the left ureter. Few additional punctate nonobstructive calculi present within the kidneys bilaterally. No right-sided ureteral calculi. No right-sided hydronephrosis or hydroureter. Bladder within normal limits. Stomach/Bowel: Stomach normal. No evidence for bowel obstruction. Appendix normal. No acute inflammatory changes about the bowels. Vascular/Lymphatic: Intra-abdominal aorta of normal caliber. Shotty subcentimeter mesenteric lymph nodes noted. No pathologically enlarged intra-abdominal or pelvic lymph nodes identified. Reproductive: Prostate normal. Other: No free air or fluid. Musculoskeletal: No acute osseus abnormality. No worrisome lytic or blastic osseous lesions. IMPRESSION: 1. 5 mm obstructive stone at the left UPJ with  secondary mild to moderate left hydronephrosis. 2. Additional punctate nonobstructive renal calculi bilaterally. 3. Cholelithiasis. 4. Hepatic steatosis. Electronically Signed   By: Rise MuBenjamin  McClintock M.D.   On: 04/10/2016 19:17    Procedures Procedures (including critical care time)  Medications Ordered in ED Medications  sodium chloride 0.9 % bolus 1,000 mL (1,000 mLs Intravenous New Bag/Given 04/10/16 1755)  ketorolac (TORADOL) 30 MG/ML injection 15 mg (15 mg Intravenous Given 04/10/16 1749)  ondansetron (ZOFRAN) injection 4 mg (4 mg Intravenous Given 04/10/16 1754)  fentaNYL (SUBLIMAZE) injection 25 mcg (25 mcg Intravenous Given 04/10/16 1750)     Initial Impression / Assessment and Plan / ED Course  I have reviewed the triage vital signs and the nursing notes.  Pertinent labs & imaging results that were available during my care of the patient were reviewed by me and considered in my medical decision making (see chart for details).  8:33 PM Patient substantially better. With no evidence for complete obstruction, nor infection, and with his improvement, patient will follow-up with urology as an outpatient. Patient provided medication, already has Flomax, advised  to take all of it as directed.   Final Clinical Impressions(s) / ED Diagnoses   Final diagnoses:  Kidney stone     Gerhard Munch, MD 04/10/16 2034

## 2016-04-10 NOTE — Discharge Instructions (Signed)
As discussed, your kidney stone is very important to monitor your condition carefully, and do not hesitate to return here for concerning changes in your condition.  Please be sure to follow-up with our urologists.

## 2016-04-10 NOTE — ED Notes (Signed)
PT states understanding of care given, follow up care, pain management and medication prescribed. PT is ambulated from ED to car with a steady gait.

## 2016-04-10 NOTE — ED Triage Notes (Signed)
Left flank pain x 2 hrs. Hx of kidney stones. Nausea. Grimacing noted. Diarrhea x 4

## 2016-04-14 MED FILL — Hydrocodone-Acetaminophen Tab 5-325 MG: ORAL | Qty: 6 | Status: AC

## 2016-06-30 ENCOUNTER — Encounter: Payer: Self-pay | Admitting: Gastroenterology

## 2018-06-03 IMAGING — CT CT RENAL STONE PROTOCOL
2 of 4 series · 16 of 46 positions shown, 18 images · non-contrast
Comparison: Prior CT from 04/08/2015.

CLINICAL DATA: Initial evaluation for acute left flank pain.

EXAM:
CT ABDOMEN AND PELVIS WITHOUT CONTRAST
TECHNIQUE: Multidetector CT imaging of the abdomen and pelvis was performed
following the standard protocol without IV contrast.

[Series 2: axial st · axial · 0.83mm/px · z∈[-450,-10]mm · 13 of 97 slices shown, 15 images]
[im 5/97  soft-tissue]
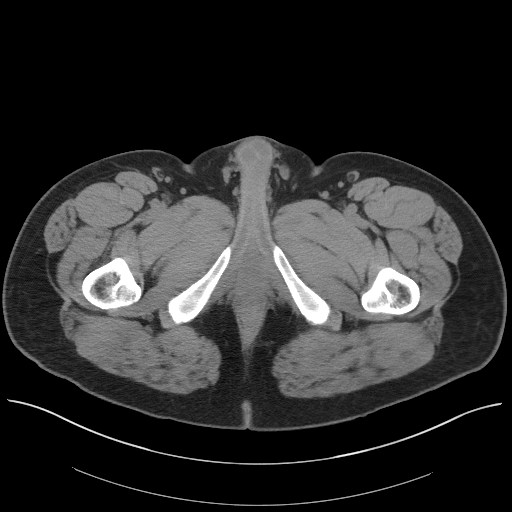
[im 5/97  bone]
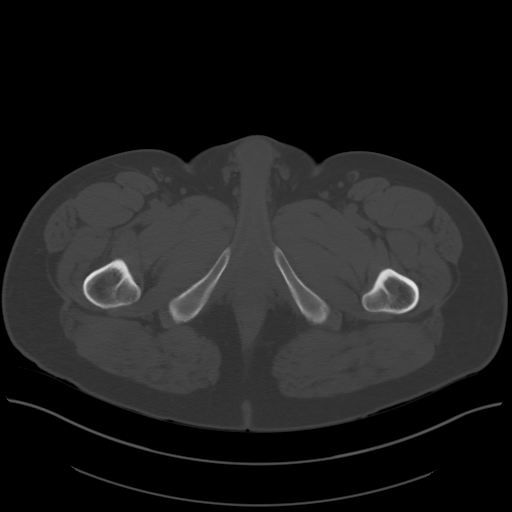
[im 13/97  soft-tissue]
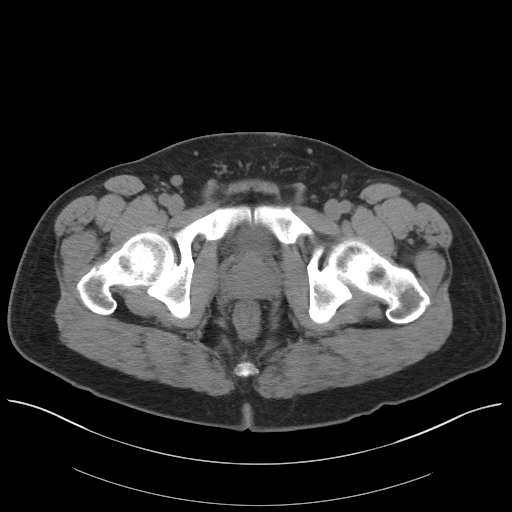
[im 21/97  soft-tissue]
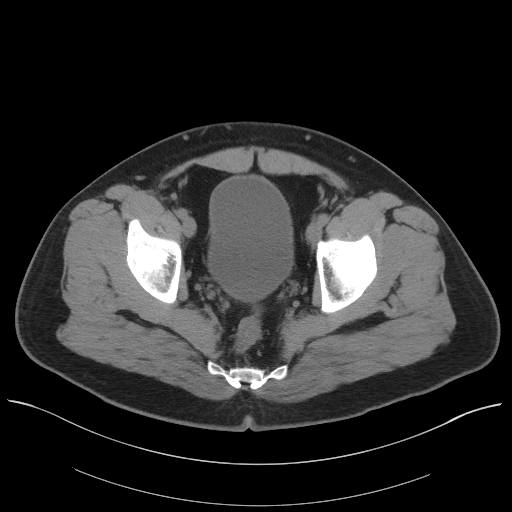
[im 29/97  soft-tissue]
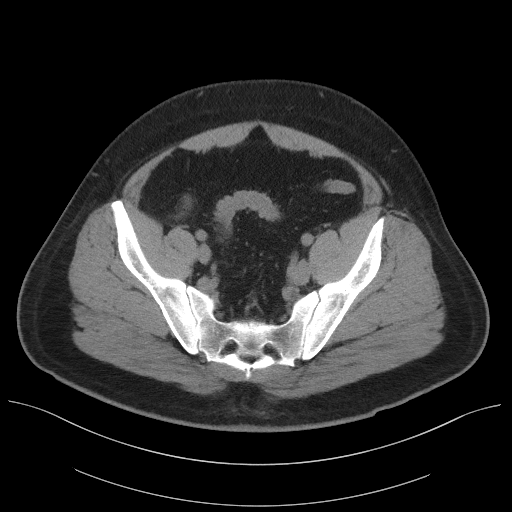
[im 33/97  soft-tissue]
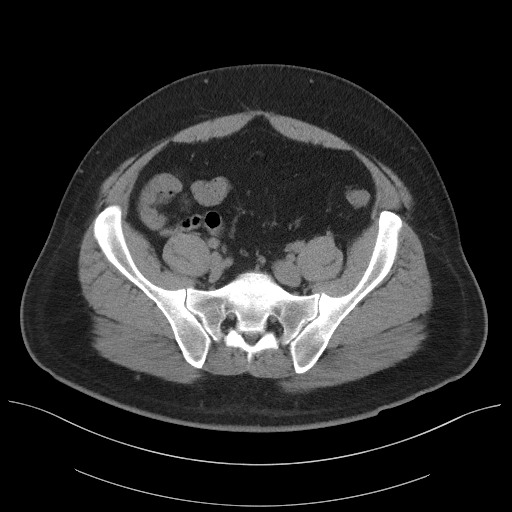
[im 41/97  soft-tissue]
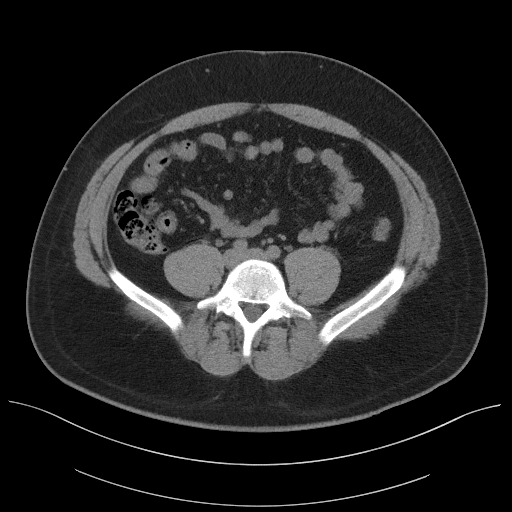
[im 49/97  soft-tissue]
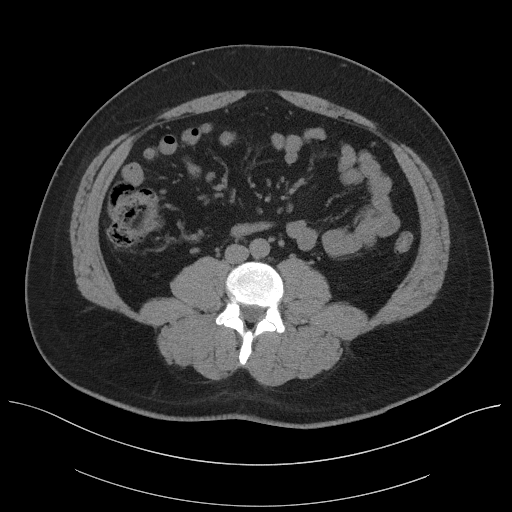
[im 57/97  soft-tissue]
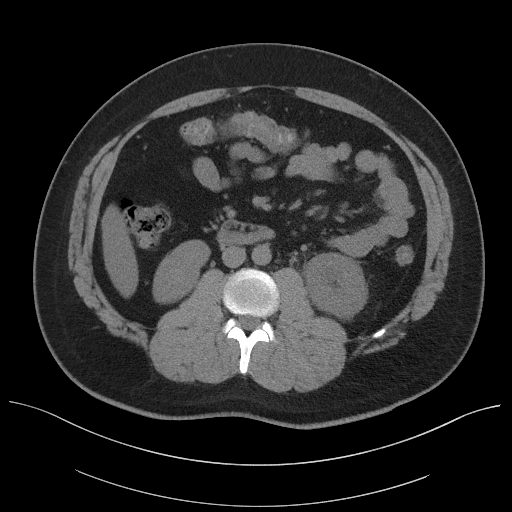
[im 65/97  soft-tissue]
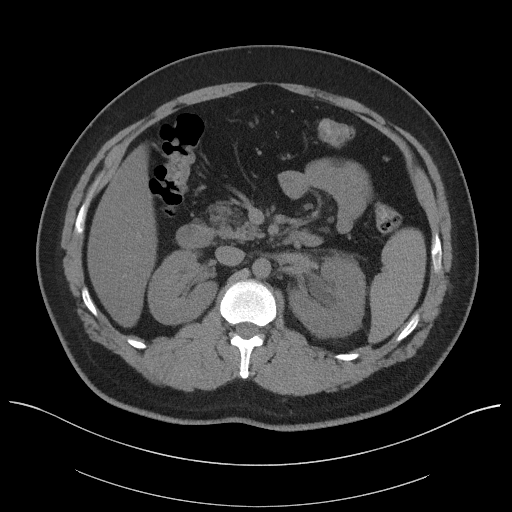
[im 65/97  bone]
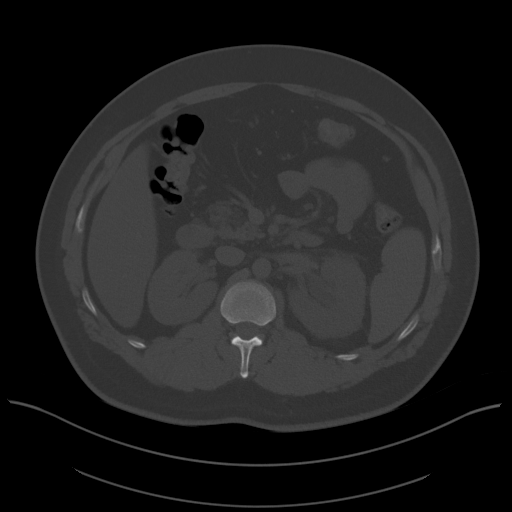
[im 69/97  soft-tissue]
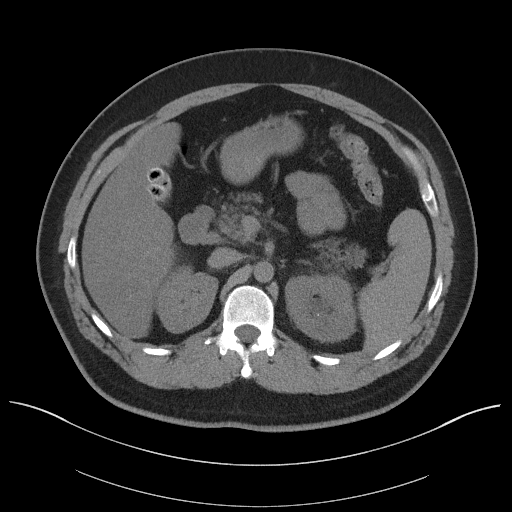
[im 77/97  soft-tissue]
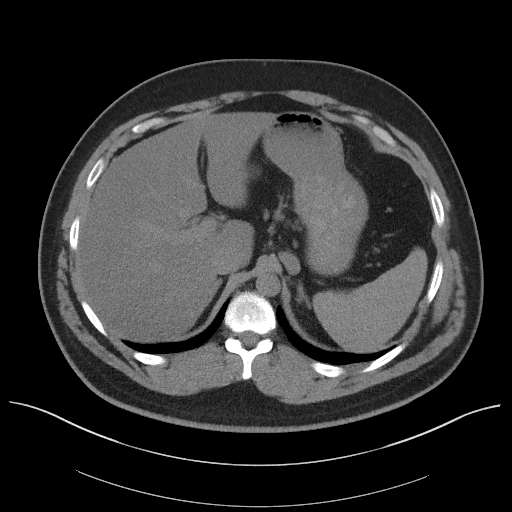
[im 85/97  soft-tissue]
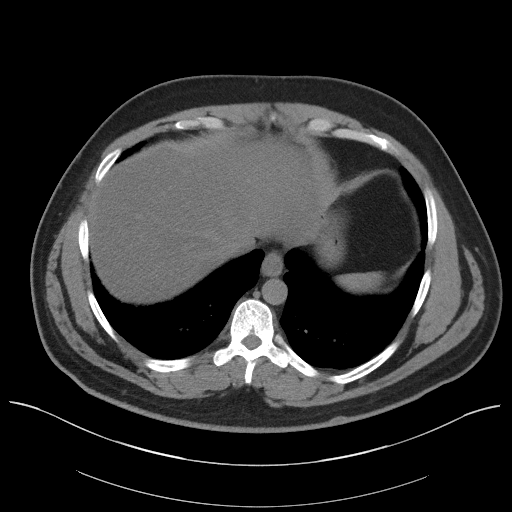
[im 93/97  soft-tissue]
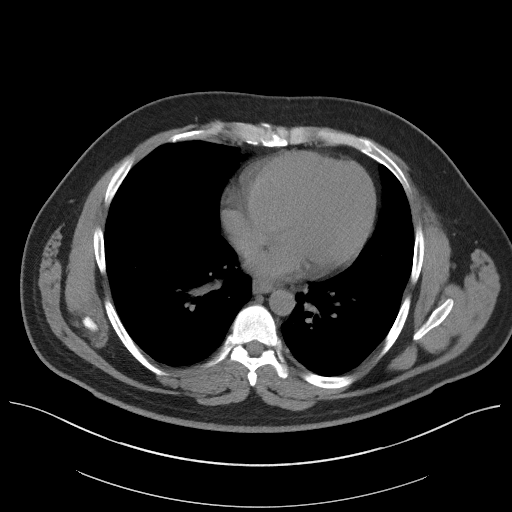

[Series 5: coronal st · coronal · 0.81mm/px · 3 of 111 slices shown]
[im 37/111  soft-tissue]
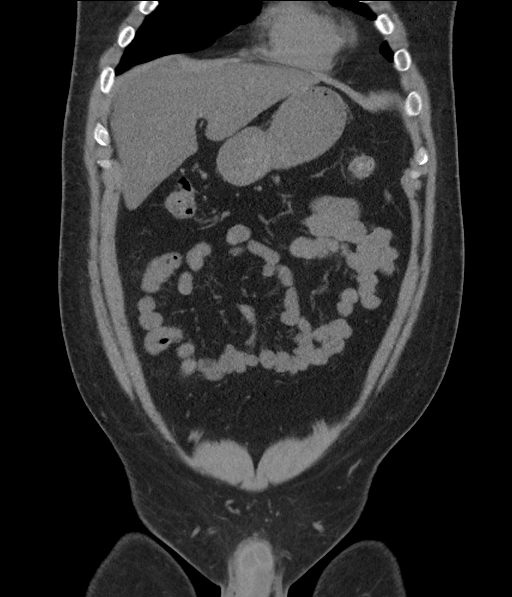
[im 49/111  soft-tissue]
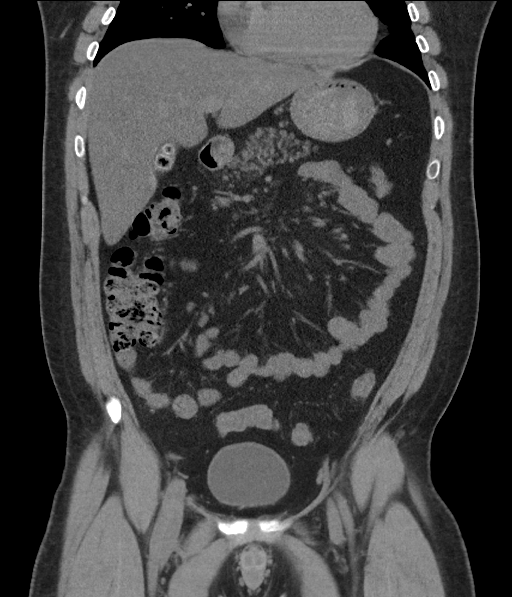
[im 62/111  soft-tissue]
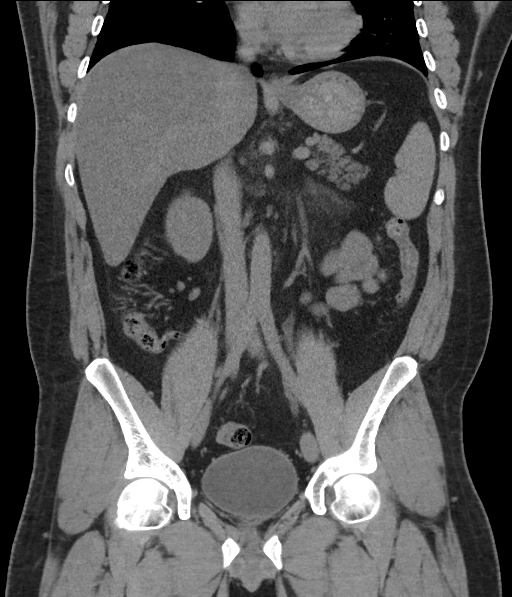

[16 of 46 positions shown; findings below may reference images not displayed]

FINDINGS: Lower chest: Mild atelectatic changes present within the lung bases,
left greater than right. Visualized lungs otherwise clear.

Hepatobiliary: Hepatic steatosis. Liver otherwise unremarkable.
Cholelithiasis. No biliary dilatation.

Pancreas: Mild diffuse fatty infiltration of the pancreas noted.
Pancreas otherwise unremarkable.

Spleen: Spleen within normal limits.

Adrenals/Urinary Tract: The adrenal glands are normal.

A 5 mm obstructive stone at the left UPJ with secondary mild to
moderate left hydroureteronephrosis. No other stones present
distally within the left ureter. Few additional punctate
nonobstructive calculi present within the kidneys bilaterally. No
right-sided ureteral calculi. No right-sided hydronephrosis or
hydroureter. Bladder within normal limits.

Stomach/Bowel: Stomach normal. No evidence for bowel obstruction.
Appendix normal. No acute inflammatory changes about the bowels.

Vascular/Lymphatic: Intra-abdominal aorta of normal caliber. Shotty
subcentimeter mesenteric lymph nodes noted. No pathologically
enlarged intra-abdominal or pelvic lymph nodes identified.

Reproductive: Prostate normal.

Other: No free air or fluid.

Musculoskeletal: No acute osseus abnormality. No worrisome lytic or
blastic osseous lesions.
IMPRESSION: 1. 5 mm obstructive stone at the left UPJ with secondary mild to
moderate left hydronephrosis.
2. Additional punctate nonobstructive renal calculi bilaterally.
3. Cholelithiasis.
4. Hepatic steatosis.

## 2018-10-12 IMAGING — US US ABDOMEN LIMITED
1 series · 14 of 25 positions shown · non-contrast
Comparison: CT abdomen/pelvis 04/08/2015

CLINICAL DATA: 30-year-old male with transaminitis

EXAM:
US ABDOMEN LIMITED - RIGHT UPPER QUADRANT

[Series 1: us abdomen limited · 0.24mm/px · 14 of 72 slices shown]
[im 1/72]
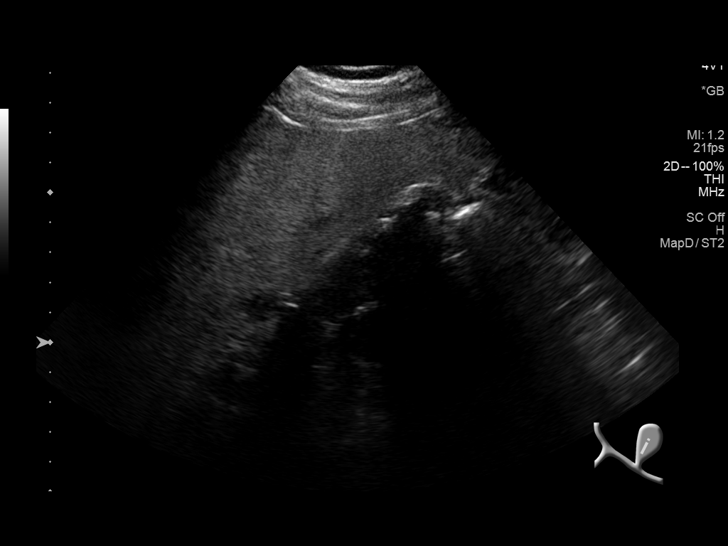
[im 6/72]
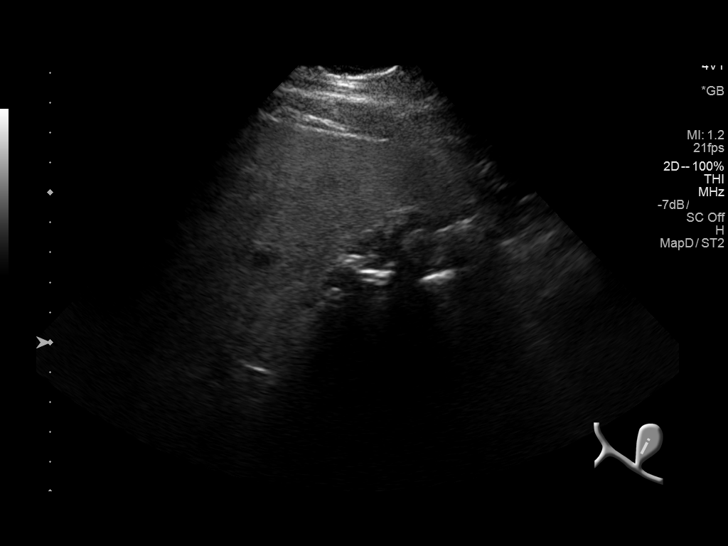
[im 12/72]
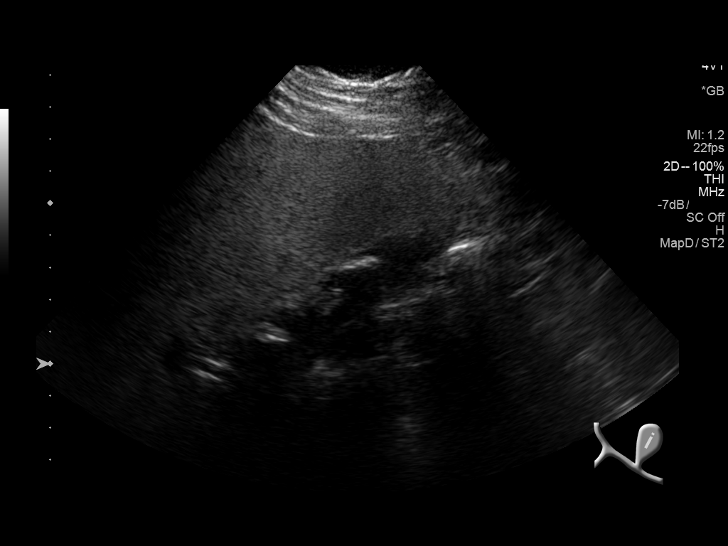
[im 18/72]
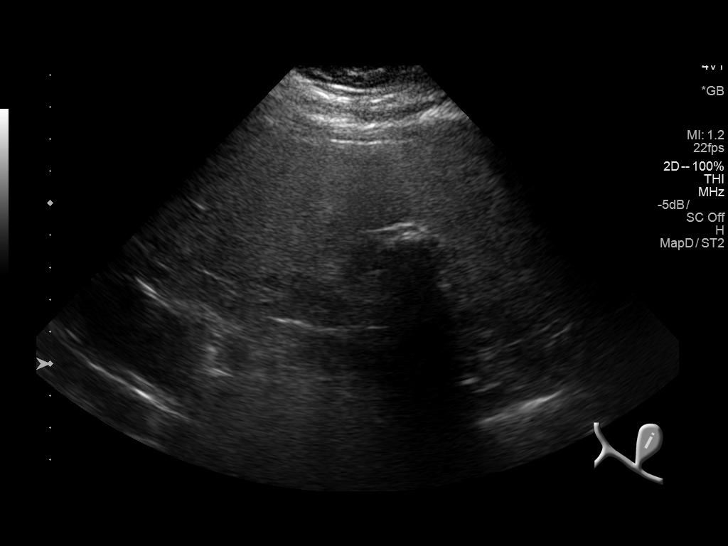
[im 24/72]
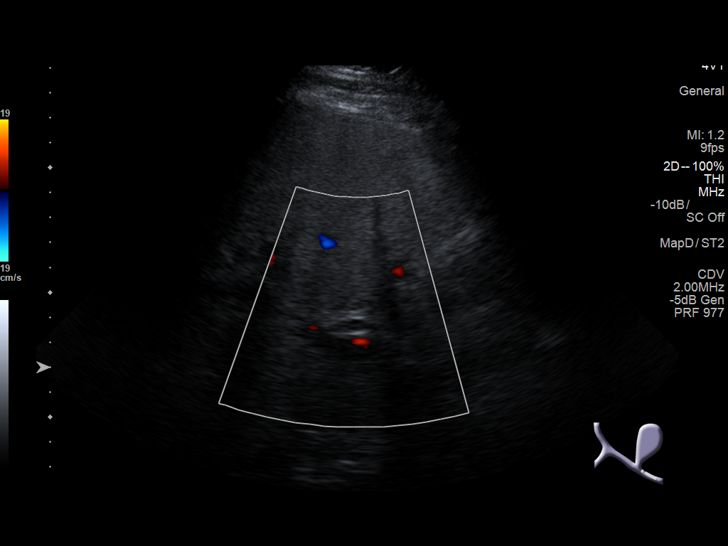
[im 27/72]
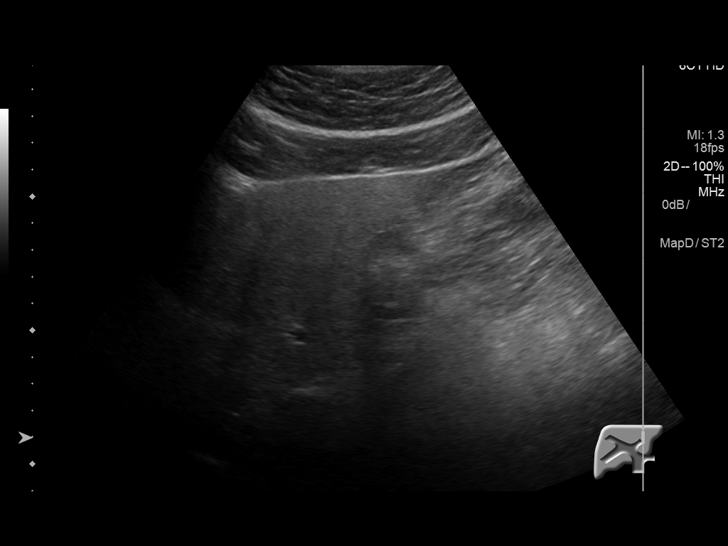
[im 33/72]
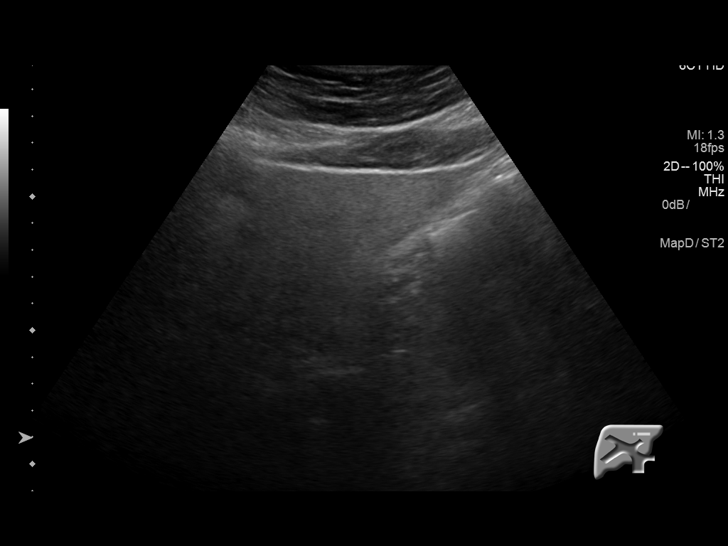
[im 39/72]
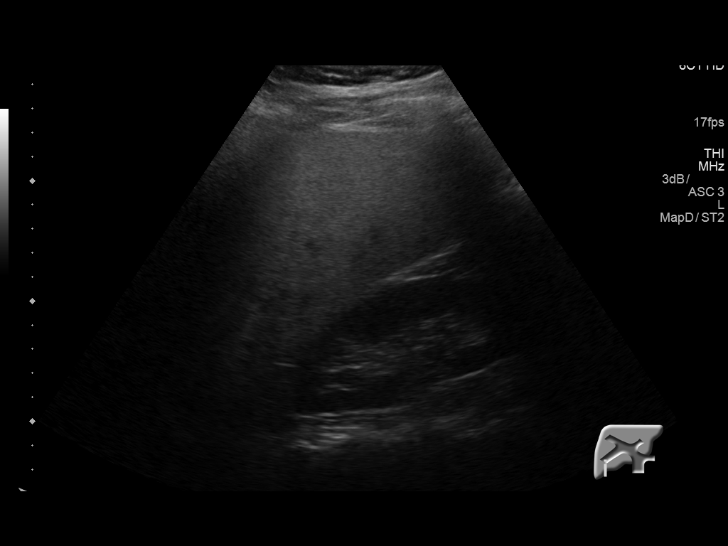
[im 45/72]
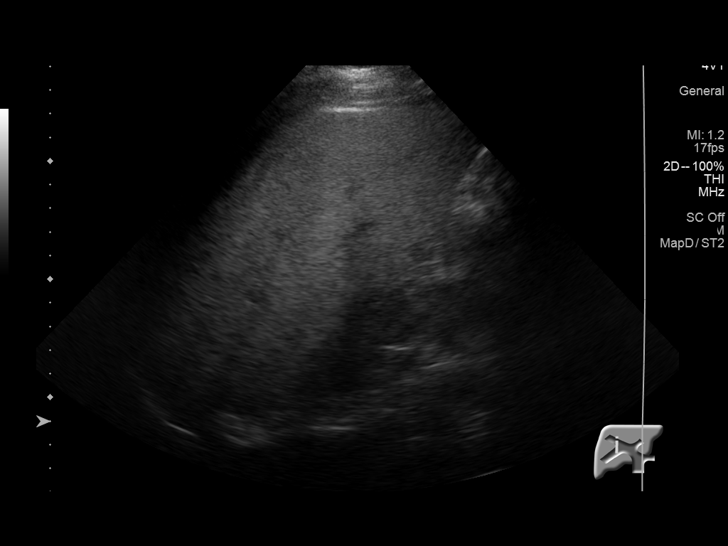
[im 48/72]
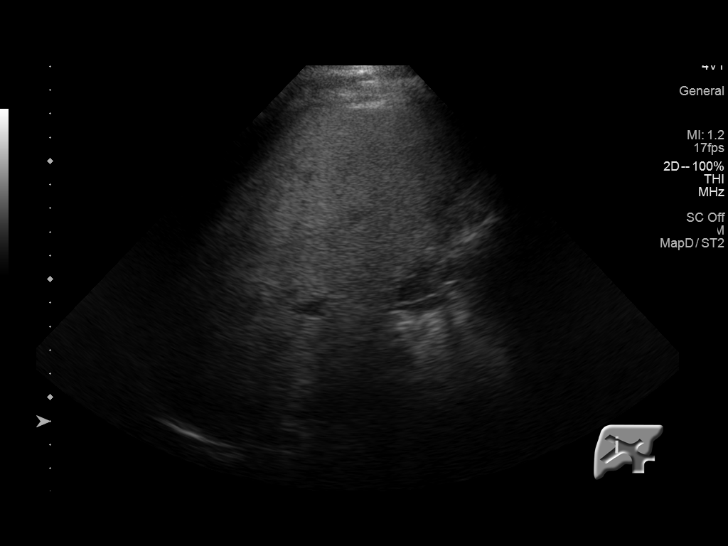
[im 54/72]
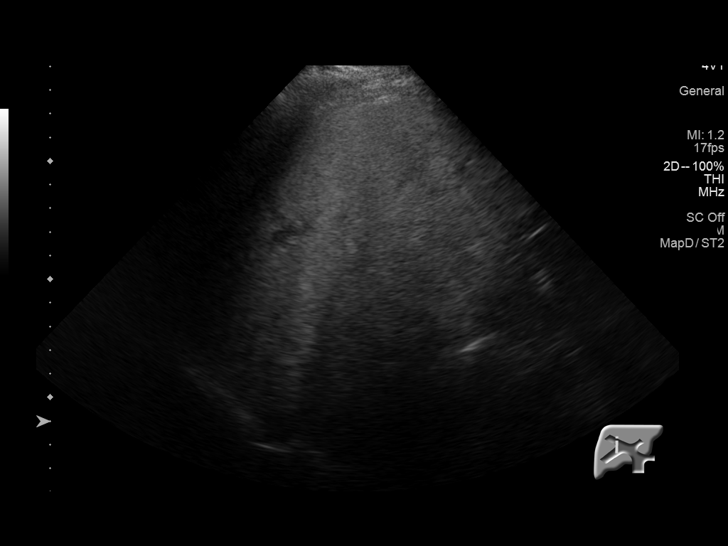
[im 60/72]
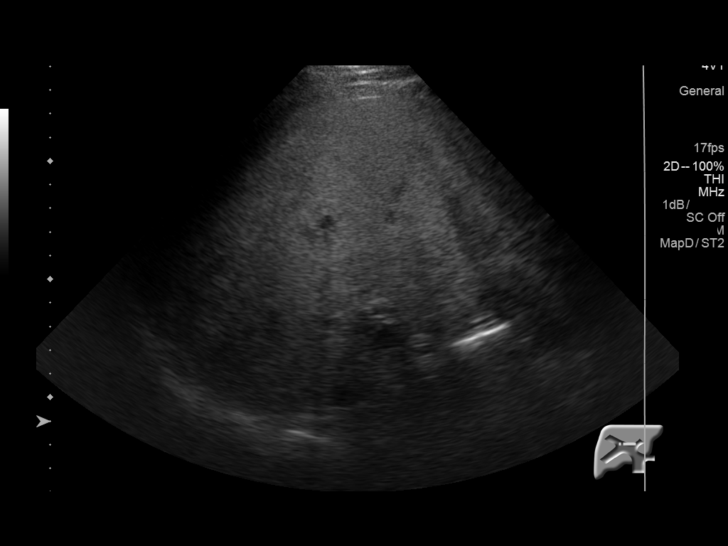
[im 66/72]
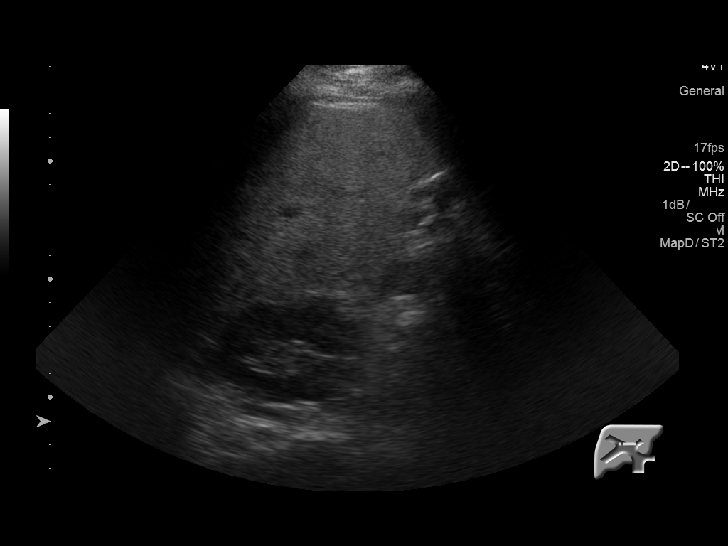
[im 72/72]
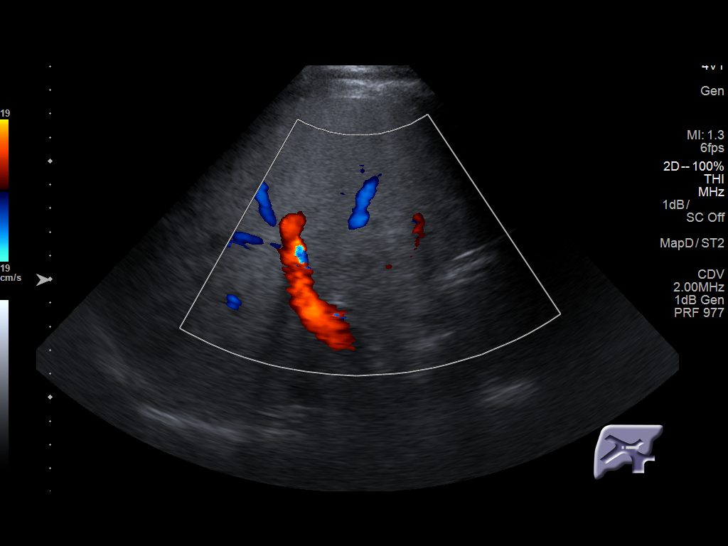

[14 of 25 positions shown; findings below may reference images not displayed]

FINDINGS: Gallbladder:

The gallbladder lumen is filled with echogenic foci demonstrating
posterior acoustic shadowing. No definite wall thickening,
pericholecystic fluid or sonographic Murphy sign.

Common bile duct:

Diameter: Within normal limits at 6 mm

Liver:

Heterogeneous and markedly hyperechoic hepatic parenchyma. The renal
parenchyma appears hypoechoic in comparison. Lobe no discrete
hepatic lesions identified. The main portal vein is patent with
hepatopetal flow.
IMPRESSION: 1. Moderate to advanced hepatic steatosis.
2. Cholelithiasis without secondary findings to suggest acute
cholecystitis.

## 2024-01-31 ENCOUNTER — Other Ambulatory Visit (HOSPITAL_COMMUNITY): Payer: Self-pay | Admitting: Internal Medicine

## 2024-01-31 DIAGNOSIS — R7989 Other specified abnormal findings of blood chemistry: Secondary | ICD-10-CM
